# Patient Record
Sex: Female | Born: 1965 | Race: Black or African American | Hispanic: No | Marital: Single | Smoking: Never smoker
Health system: Southern US, Community
[De-identification: ages and names within clinical notes are randomized; demographics above are authoritative.]

## PROBLEM LIST (undated history)

## (undated) DIAGNOSIS — R51 Headache: Secondary | ICD-10-CM

## (undated) DIAGNOSIS — L659 Nonscarring hair loss, unspecified: Secondary | ICD-10-CM

## (undated) DIAGNOSIS — E785 Hyperlipidemia, unspecified: Secondary | ICD-10-CM

## (undated) HISTORY — PX: ANKLE FRACTURE SURGERY: SHX122

## (undated) HISTORY — DX: Hyperlipidemia, unspecified: E78.5

## (undated) HISTORY — DX: Headache: R51

## (undated) HISTORY — PX: OTHER SURGICAL HISTORY: SHX169

## (undated) HISTORY — DX: Nonscarring hair loss, unspecified: L65.9

---

## 2005-04-24 ENCOUNTER — Ambulatory Visit (HOSPITAL_COMMUNITY): Admission: RE | Admit: 2005-04-24 | Discharge: 2005-04-24 | Payer: Self-pay | Admitting: Unknown Physician Specialty

## 2005-05-13 ENCOUNTER — Encounter: Admission: RE | Admit: 2005-05-13 | Discharge: 2005-05-13 | Payer: Self-pay | Admitting: Unknown Physician Specialty

## 2007-02-17 ENCOUNTER — Ambulatory Visit (HOSPITAL_COMMUNITY): Admission: RE | Admit: 2007-02-17 | Discharge: 2007-02-17 | Payer: Self-pay | Admitting: Obstetrics and Gynecology

## 2008-02-18 ENCOUNTER — Ambulatory Visit (HOSPITAL_COMMUNITY): Admission: RE | Admit: 2008-02-18 | Discharge: 2008-02-18 | Payer: Self-pay | Admitting: Obstetrics and Gynecology

## 2009-02-26 ENCOUNTER — Ambulatory Visit (HOSPITAL_COMMUNITY): Admission: RE | Admit: 2009-02-26 | Discharge: 2009-02-26 | Payer: Self-pay | Admitting: Obstetrics and Gynecology

## 2010-02-25 ENCOUNTER — Ambulatory Visit (HOSPITAL_COMMUNITY)
Admission: RE | Admit: 2010-02-25 | Discharge: 2010-02-25 | Payer: Self-pay | Source: Home / Self Care | Attending: Obstetrics and Gynecology | Admitting: Obstetrics and Gynecology

## 2010-03-03 ENCOUNTER — Encounter: Payer: Self-pay | Admitting: Unknown Physician Specialty

## 2010-05-08 ENCOUNTER — Ambulatory Visit: Payer: Self-pay | Admitting: *Deleted

## 2010-06-04 ENCOUNTER — Encounter: Payer: BC Managed Care – PPO | Attending: Obstetrics and Gynecology | Admitting: *Deleted

## 2010-06-04 DIAGNOSIS — E119 Type 2 diabetes mellitus without complications: Secondary | ICD-10-CM | POA: Insufficient documentation

## 2010-06-04 DIAGNOSIS — Z713 Dietary counseling and surveillance: Secondary | ICD-10-CM | POA: Insufficient documentation

## 2011-02-24 ENCOUNTER — Other Ambulatory Visit (HOSPITAL_COMMUNITY): Payer: Self-pay | Admitting: Obstetrics and Gynecology

## 2011-02-24 DIAGNOSIS — Z1231 Encounter for screening mammogram for malignant neoplasm of breast: Secondary | ICD-10-CM

## 2011-03-28 ENCOUNTER — Ambulatory Visit (HOSPITAL_COMMUNITY)
Admission: RE | Admit: 2011-03-28 | Discharge: 2011-03-28 | Disposition: A | Discharge status: Home / Self Care | Payer: BC Managed Care – PPO | Source: Ambulatory Visit | Attending: Obstetrics and Gynecology | Admitting: Obstetrics and Gynecology

## 2011-03-28 DIAGNOSIS — Z1231 Encounter for screening mammogram for malignant neoplasm of breast: Secondary | ICD-10-CM

## 2011-04-15 ENCOUNTER — Ambulatory Visit (INDEPENDENT_AMBULATORY_CARE_PROVIDER_SITE_OTHER): Payer: BC Managed Care – PPO | Admitting: Obstetrics and Gynecology

## 2011-04-15 DIAGNOSIS — Z0000 Encounter for general adult medical examination without abnormal findings: Secondary | ICD-10-CM

## 2011-04-15 DIAGNOSIS — Z124 Encounter for screening for malignant neoplasm of cervix: Secondary | ICD-10-CM

## 2011-04-15 DIAGNOSIS — Z01419 Encounter for gynecological examination (general) (routine) without abnormal findings: Secondary | ICD-10-CM

## 2011-05-14 ENCOUNTER — Encounter (INDEPENDENT_AMBULATORY_CARE_PROVIDER_SITE_OTHER): Payer: BC Managed Care – PPO | Admitting: Obstetrics and Gynecology

## 2011-05-14 DIAGNOSIS — E785 Hyperlipidemia, unspecified: Secondary | ICD-10-CM

## 2011-06-24 ENCOUNTER — Other Ambulatory Visit: Payer: BC Managed Care – PPO

## 2011-06-24 DIAGNOSIS — Z79899 Other long term (current) drug therapy: Secondary | ICD-10-CM

## 2011-06-24 DIAGNOSIS — E785 Hyperlipidemia, unspecified: Secondary | ICD-10-CM

## 2011-06-25 ENCOUNTER — Encounter: Payer: BC Managed Care – PPO | Admitting: Obstetrics and Gynecology

## 2011-06-25 LAB — COMPREHENSIVE METABOLIC PANEL
Albumin: 3.9 g/dL (ref 3.5–5.2)
Alkaline Phosphatase: 43 U/L (ref 39–117)
CO2: 25 mEq/L (ref 19–32)
Total Bilirubin: 0.2 mg/dL — ABNORMAL LOW (ref 0.3–1.2)

## 2011-07-01 ENCOUNTER — Encounter: Payer: Self-pay | Admitting: Obstetrics and Gynecology

## 2011-07-01 ENCOUNTER — Ambulatory Visit (INDEPENDENT_AMBULATORY_CARE_PROVIDER_SITE_OTHER): Payer: BC Managed Care – PPO | Admitting: Obstetrics and Gynecology

## 2011-07-01 VITALS — BP 122/80 | Resp 16 | Ht 63.0 in | Wt 168.0 lb

## 2011-07-01 DIAGNOSIS — R51 Headache: Secondary | ICD-10-CM

## 2011-07-01 DIAGNOSIS — G43829 Menstrual migraine, not intractable, without status migrainosus: Secondary | ICD-10-CM

## 2011-07-01 DIAGNOSIS — E669 Obesity, unspecified: Secondary | ICD-10-CM

## 2011-07-01 DIAGNOSIS — E7800 Pure hypercholesterolemia, unspecified: Secondary | ICD-10-CM

## 2011-07-01 NOTE — Progress Notes (Signed)
Ms. Claire Lee is a 46 y.o. year old female,G1P1, who presents for a problem visit. She has a history of hypercholesterolemia.  She was started on pravastatin 40 mg each day.  Her comprehensive metabolic panel was normal.  Subjective:  No side effects associated with the medication  Objective:  BP 122/80  Resp 16  Ht 5\' 3"  (1.6 m)  Wt 168 lb (76.204 kg)  BMI 29.76 kg/m2  LMP 06/09/2011   General: alert and cooperative  Exam deferred.  Assessment:  Hypercholesterolemia  Obesity  Tolerating pravastatin well  Plan:  Returned in October 2013. Repeat lipids Diet, exercise, medications, and side effects were reviewed.  Return to office in 5 month(s).   Leonard Schwartz M.D.  07/01/2011 5:16 PM

## 2011-07-02 DIAGNOSIS — E7800 Pure hypercholesterolemia, unspecified: Secondary | ICD-10-CM | POA: Insufficient documentation

## 2011-07-02 DIAGNOSIS — R51 Headache: Secondary | ICD-10-CM | POA: Insufficient documentation

## 2011-07-02 DIAGNOSIS — R519 Headache, unspecified: Secondary | ICD-10-CM | POA: Insufficient documentation

## 2011-07-02 DIAGNOSIS — E669 Obesity, unspecified: Secondary | ICD-10-CM | POA: Insufficient documentation

## 2011-07-10 ENCOUNTER — Telehealth: Payer: Self-pay | Admitting: Obstetrics and Gynecology

## 2011-07-10 NOTE — Telephone Encounter (Signed)
Latoya/AVS pt. °

## 2011-07-11 ENCOUNTER — Telehealth: Payer: Self-pay

## 2011-07-11 NOTE — Telephone Encounter (Signed)
Pt call was returned on 07-11-11.  Pt states she has not had her cycle in two months.  She stated that her pharm [facility] in [facility] seems to always change her birth control.  pt is currently on Junel. She states she is not pregnant b/c she is not sexually active.  And she would appreciate if her birth control was changed to something stronger to get her cycle regulated.  Pt does not want to continue Junel since her cycle has not started.   She would like just a "month supply" of a new birth control to see if it will help start her cycle.  Pt made aware that Dr.Stringer is out of the office today and it will be next week that Rx may be called in. [facility] CMA

## 2011-07-12 MED ORDER — NORETHINDRONE-ETH ESTRADIOL 0.5-35 MG-MCG PO TABS: 1.0000 | ORAL_TABLET | Freq: Every day | ORAL | Status: DC

## 2011-07-16 ENCOUNTER — Telehealth: Payer: Self-pay

## 2011-07-16 NOTE — Telephone Encounter (Signed)
Pt confirmed that she received Rx and she will give [place] a call back to f/u. [facility] CMA

## 2011-07-16 NOTE — Telephone Encounter (Signed)
I called [facility] in [place] to confirm that Rx for Necon was received per Dr.Stringer.  per pharmacist  Selena Batten it has been received. Pt was called to be made aware but was unava. LVMOM for pt to call back.

## 2011-07-16 NOTE — Telephone Encounter (Signed)
Per Dr.Stringer 07-15-11 he prescribed Necon 1/35 mcg for pt and it was sent to her pharm.

## 2011-11-27 ENCOUNTER — Other Ambulatory Visit: Payer: Self-pay | Admitting: Obstetrics and Gynecology

## 2011-12-01 ENCOUNTER — Other Ambulatory Visit: Payer: Self-pay | Admitting: Obstetrics and Gynecology

## 2011-12-01 ENCOUNTER — Telehealth: Payer: Self-pay | Admitting: Obstetrics and Gynecology
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
## 2011-12-02 ENCOUNTER — Other Ambulatory Visit: Payer: Self-pay | Admitting: Obstetrics and Gynecology

## 2011-12-02 ENCOUNTER — Telehealth: Payer: Self-pay | Admitting: Obstetrics and Gynecology

## 2011-12-02 MED ORDER — NORETHINDRONE-ETH ESTRADIOL 0.5-35 MG-MCG PO TABS: 1.0000 | ORAL_TABLET | Freq: Every day | ORAL | Status: AC

## 2011-12-02 NOTE — Telephone Encounter (Signed)
Tc to pt regarding msg.  Pt states that pharmacist was notified that pt needed an appt before having rx refilled.  Pt had her AEX 3/13, was started on new rx for BCPs in 6/13.  Per AVS ok to refill Necon until next AEX.  Necon e-prescribed to pt's pharmacy, pt made aware.

## 2012-03-02 ENCOUNTER — Other Ambulatory Visit: Payer: Self-pay | Admitting: Obstetrics and Gynecology

## 2012-03-02 DIAGNOSIS — Z1231 Encounter for screening mammogram for malignant neoplasm of breast: Secondary | ICD-10-CM

## 2012-04-02 ENCOUNTER — Ambulatory Visit (HOSPITAL_COMMUNITY)
Admission: RE | Admit: 2012-04-02 | Discharge: 2012-04-02 | Disposition: A | Discharge status: Home / Self Care | Payer: BC Managed Care – PPO | Source: Ambulatory Visit | Attending: Obstetrics and Gynecology | Admitting: Obstetrics and Gynecology

## 2012-04-02 DIAGNOSIS — Z1231 Encounter for screening mammogram for malignant neoplasm of breast: Secondary | ICD-10-CM | POA: Insufficient documentation

## 2012-04-05 ENCOUNTER — Other Ambulatory Visit: Payer: Self-pay | Admitting: Obstetrics and Gynecology

## 2012-04-05 DIAGNOSIS — R928 Other abnormal and inconclusive findings on diagnostic imaging of breast: Secondary | ICD-10-CM

## 2012-04-19 ENCOUNTER — Other Ambulatory Visit: Payer: BC Managed Care – PPO

## 2012-04-28 ENCOUNTER — Ambulatory Visit
Admission: RE | Admit: 2012-04-28 | Discharge: 2012-04-28 | Disposition: A | Discharge status: Home / Self Care | Payer: BC Managed Care – PPO | Source: Ambulatory Visit | Attending: Obstetrics and Gynecology | Admitting: Obstetrics and Gynecology

## 2012-04-28 DIAGNOSIS — R928 Other abnormal and inconclusive findings on diagnostic imaging of breast: Secondary | ICD-10-CM

## 2013-03-08 ENCOUNTER — Other Ambulatory Visit: Payer: Self-pay | Admitting: Obstetrics and Gynecology

## 2013-03-08 DIAGNOSIS — Z1231 Encounter for screening mammogram for malignant neoplasm of breast: Secondary | ICD-10-CM

## 2013-04-07 ENCOUNTER — Ambulatory Visit (HOSPITAL_COMMUNITY): Payer: BC Managed Care – PPO

## 2013-04-14 ENCOUNTER — Ambulatory Visit (HOSPITAL_COMMUNITY)
Admission: RE | Admit: 2013-04-14 | Discharge: 2013-04-14 | Disposition: A | Discharge status: Home / Self Care | Payer: BC Managed Care – PPO | Source: Ambulatory Visit | Attending: Obstetrics and Gynecology | Admitting: Obstetrics and Gynecology

## 2013-04-14 DIAGNOSIS — Z1231 Encounter for screening mammogram for malignant neoplasm of breast: Secondary | ICD-10-CM | POA: Insufficient documentation

## 2013-04-15 ENCOUNTER — Emergency Department (HOSPITAL_BASED_OUTPATIENT_CLINIC_OR_DEPARTMENT_OTHER)
Admission: EM | Admit: 2013-04-15 | Discharge: 2013-04-15 | Disposition: A | Discharge status: Home / Self Care | Payer: BC Managed Care – PPO | Attending: Emergency Medicine | Admitting: Emergency Medicine

## 2013-04-15 ENCOUNTER — Encounter (HOSPITAL_BASED_OUTPATIENT_CLINIC_OR_DEPARTMENT_OTHER): Payer: Self-pay | Admitting: Emergency Medicine

## 2013-04-15 ENCOUNTER — Emergency Department (HOSPITAL_BASED_OUTPATIENT_CLINIC_OR_DEPARTMENT_OTHER): Payer: BC Managed Care – PPO

## 2013-04-15 DIAGNOSIS — M549 Dorsalgia, unspecified: Secondary | ICD-10-CM

## 2013-04-15 DIAGNOSIS — Y92410 Unspecified street and highway as the place of occurrence of the external cause: Secondary | ICD-10-CM | POA: Insufficient documentation

## 2013-04-15 DIAGNOSIS — Y9389 Activity, other specified: Secondary | ICD-10-CM | POA: Insufficient documentation

## 2013-04-15 DIAGNOSIS — Z862 Personal history of diseases of the blood and blood-forming organs and certain disorders involving the immune mechanism: Secondary | ICD-10-CM | POA: Insufficient documentation

## 2013-04-15 DIAGNOSIS — S79919A Unspecified injury of unspecified hip, initial encounter: Secondary | ICD-10-CM | POA: Insufficient documentation

## 2013-04-15 DIAGNOSIS — S8990XA Unspecified injury of unspecified lower leg, initial encounter: Secondary | ICD-10-CM | POA: Insufficient documentation

## 2013-04-15 DIAGNOSIS — IMO0002 Reserved for concepts with insufficient information to code with codable children: Secondary | ICD-10-CM | POA: Insufficient documentation

## 2013-04-15 DIAGNOSIS — S99929A Unspecified injury of unspecified foot, initial encounter: Secondary | ICD-10-CM

## 2013-04-15 DIAGNOSIS — S79929A Unspecified injury of unspecified thigh, initial encounter: Secondary | ICD-10-CM

## 2013-04-15 DIAGNOSIS — Z79899 Other long term (current) drug therapy: Secondary | ICD-10-CM | POA: Insufficient documentation

## 2013-04-15 DIAGNOSIS — L659 Nonscarring hair loss, unspecified: Secondary | ICD-10-CM | POA: Insufficient documentation

## 2013-04-15 DIAGNOSIS — S99919A Unspecified injury of unspecified ankle, initial encounter: Secondary | ICD-10-CM

## 2013-04-15 DIAGNOSIS — Z8639 Personal history of other endocrine, nutritional and metabolic disease: Secondary | ICD-10-CM | POA: Insufficient documentation

## 2013-04-15 MED ORDER — IBUPROFEN 800 MG PO TABS
800.0000 mg | ORAL_TABLET | Freq: Once | ORAL | Status: AC
  Administered 2013-04-15: 800 mg via ORAL
  Filled 2013-04-15: qty 1

## 2013-04-15 NOTE — Discharge Instructions (Signed)
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
## 2013-12-12 ENCOUNTER — Encounter (HOSPITAL_BASED_OUTPATIENT_CLINIC_OR_DEPARTMENT_OTHER): Payer: Self-pay | Admitting: Emergency Medicine

## 2014-03-22 ENCOUNTER — Other Ambulatory Visit (HOSPITAL_COMMUNITY): Payer: Self-pay | Admitting: Obstetrics and Gynecology

## 2014-03-22 DIAGNOSIS — Z1231 Encounter for screening mammogram for malignant neoplasm of breast: Secondary | ICD-10-CM

## 2014-04-26 ENCOUNTER — Ambulatory Visit (HOSPITAL_COMMUNITY): Payer: BLUE CROSS/BLUE SHIELD

## 2014-04-27 ENCOUNTER — Ambulatory Visit (HOSPITAL_COMMUNITY)
Admission: RE | Admit: 2014-04-27 | Discharge: 2014-04-27 | Disposition: A | Discharge status: Home / Self Care | Payer: BLUE CROSS/BLUE SHIELD | Source: Ambulatory Visit | Attending: Obstetrics and Gynecology | Admitting: Obstetrics and Gynecology

## 2014-04-27 DIAGNOSIS — Z1231 Encounter for screening mammogram for malignant neoplasm of breast: Secondary | ICD-10-CM | POA: Diagnosis not present

## 2014-04-28 ENCOUNTER — Other Ambulatory Visit: Payer: Self-pay | Admitting: Obstetrics and Gynecology

## 2014-04-28 DIAGNOSIS — R928 Other abnormal and inconclusive findings on diagnostic imaging of breast: Secondary | ICD-10-CM

## 2014-05-03 ENCOUNTER — Ambulatory Visit
Admission: RE | Admit: 2014-05-03 | Discharge: 2014-05-03 | Disposition: A | Discharge status: Home / Self Care | Payer: BLUE CROSS/BLUE SHIELD | Source: Ambulatory Visit | Attending: Obstetrics and Gynecology | Admitting: Obstetrics and Gynecology

## 2014-05-03 DIAGNOSIS — R928 Other abnormal and inconclusive findings on diagnostic imaging of breast: Secondary | ICD-10-CM

## 2014-08-03 ENCOUNTER — Other Ambulatory Visit: Payer: Self-pay | Admitting: Obstetrics and Gynecology

## 2014-08-03 DIAGNOSIS — N63 Unspecified lump in unspecified breast: Secondary | ICD-10-CM

## 2014-08-09 ENCOUNTER — Ambulatory Visit
Admission: RE | Admit: 2014-08-09 | Discharge: 2014-08-09 | Disposition: A | Discharge status: Home / Self Care | Payer: 59 | Source: Ambulatory Visit | Attending: Obstetrics and Gynecology | Admitting: Obstetrics and Gynecology

## 2014-08-09 DIAGNOSIS — N63 Unspecified lump in unspecified breast: Secondary | ICD-10-CM

## 2015-04-23 ENCOUNTER — Other Ambulatory Visit: Payer: Self-pay

## 2015-04-23 DIAGNOSIS — Z1231 Encounter for screening mammogram for malignant neoplasm of breast: Secondary | ICD-10-CM

## 2015-05-11 ENCOUNTER — Ambulatory Visit
Admission: RE | Admit: 2015-05-11 | Discharge: 2015-05-11 | Disposition: A | Discharge status: Home / Self Care | Payer: 59 | Source: Ambulatory Visit

## 2015-05-11 DIAGNOSIS — Z1231 Encounter for screening mammogram for malignant neoplasm of breast: Secondary | ICD-10-CM

## 2016-01-14 ENCOUNTER — Ambulatory Visit (HOSPITAL_COMMUNITY): Admission: RE | Admit: 2016-01-14 | Payer: 59 | Source: Ambulatory Visit | Admitting: Obstetrics and Gynecology

## 2016-01-14 ENCOUNTER — Encounter (HOSPITAL_COMMUNITY): Admission: RE | Payer: Self-pay | Source: Ambulatory Visit

## 2016-01-14 SURGERY — HYSTERECTOMY, VAGINAL
Anesthesia: General | Laterality: Bilateral

## 2016-02-26 ENCOUNTER — Ambulatory Visit (INDEPENDENT_AMBULATORY_CARE_PROVIDER_SITE_OTHER): Payer: 59 | Admitting: Endocrinology

## 2016-02-26 ENCOUNTER — Encounter: Payer: Self-pay | Admitting: Endocrinology

## 2016-02-26 VITALS — BP 148/98 | HR 81 | Ht 63.5 in | Wt 183.0 lb

## 2016-02-26 DIAGNOSIS — R030 Elevated blood-pressure reading, without diagnosis of hypertension: Secondary | ICD-10-CM

## 2016-02-26 DIAGNOSIS — L659 Nonscarring hair loss, unspecified: Secondary | ICD-10-CM

## 2016-02-26 DIAGNOSIS — N912 Amenorrhea, unspecified: Secondary | ICD-10-CM | POA: Diagnosis not present

## 2016-02-26 NOTE — Progress Notes (Signed)
Patient ID: Claire Lee, female   DOB: 07-13-1965, 51 y.o.   MRN: [id number]           Referring physician: Stefano GaulStringer  Chief complaint: ?  Hormonal problems  History of Present Illness:  About 10 years ago the patient stopped having menstrual cycles Also she was starting to lose hair She does not know what her initial evaluation shown in no records available of this She was started on birth control pills by her gynecologist and apparently with this she started hanging normal menstrual cycles Also her hair shedding improved with the birth control pills  More recently the patient had been having problems with heavy menstrual cycles with pain and has been recommended partial hysterectomy for her fibroids She was told her ovaries look normal on the ultrasound and no formal report is available She does want to have her hormones evaluated now She thinks that when she stopped her birth control pills last August her menstrual cycle did not come back and she started losing more here  She went back on the birth control pill about 2 weeks ago and is taking only norethindrone      Past Medical History:  Diagnosis Date  . Hair loss    08/2005  . Headache(784.0)    prior to cycles.  . Hyperlipidemia     Past Surgical History:  Procedure Laterality Date  . ANKLE FRACTURE SURGERY    . wisedom teeth      Family History  Problem Relation Age of Onset  . Diabetes Mother   . Arthritis Mother   . Thyroid disease Mother   . Diabetes Brother   . Cancer Neg Hx     Social History:  reports that she has never smoked. She has never used smokeless tobacco. She reports that she does not drink alcohol or use drugs.  Allergies: No Known Allergies  Allergies as of 02/26/2016   No Known Allergies     Medication List       Accurate as of 02/26/16 11:58 AM. Always use your most recent med list.          norethindrone-ethinyl estradiol 0.5-35 MG-MCG tablet Commonly known as:   NECON,BREVICON,MODICON Take 1 tablet by mouth daily.   norethindrone-ethinyl estradiol 1-20 MG-MCG tablet Commonly known as:  JUNEL FE,GILDESS FE,LOESTRIN FE Take 1 tablet by mouth daily.       LABS:  No visits with results within 1 Week(s) from this visit.  Latest known visit with results is:  Lab on 06/24/2011  Component Date Value Ref Range Status  . Sodium 06/25/2011 137  135 - 145 mEq/L Final  . Potassium 06/25/2011 4.4  3.5 - 5.3 mEq/L Final  . Chloride 06/25/2011 104  96 - 112 mEq/L Final  . CO2 06/25/2011 25  19 - 32 mEq/L Final  . Glucose, Bld 06/25/2011 79  70 - 99 mg/dL Final  . BUN 35/57/322005/15/2013 13  6 - 23 mg/dL Final  . Creat 25/42/706205/15/2013 0.87  0.50 - 1.10 mg/dL Final  . Total Bilirubin 06/25/2011 0.2* 0.3 - 1.2 mg/dL Final  . Alkaline Phosphatase 06/25/2011 43  39 - 117 U/L Final  . AST 06/25/2011 15  0 - 37 U/L Final  . ALT 06/25/2011 8  0 - 35 U/L Final  . Total Protein 06/25/2011 6.6  6.0 - 8.3 g/dL Final  . Albumin 37/62/831505/15/2013 3.9  3.5 - 5.2 g/dL Final  . Calcium 17/61/607305/15/2013 9.1  8.4 - 10.5 mg/dL Final  Review of Systems  Constitutional: Negative for malaise.       Her weight has been fairly stable over the last few years within 5-10 pounds  HENT: Negative for headaches.   Eyes: Negative for visual disturbance.  Respiratory: Negative for daytime sleepiness.   Cardiovascular: Negative for leg swelling.  Gastrointestinal: Positive for constipation.  Endocrine: Positive for menstrual changes. Negative for fatigue, cold intolerance and breast discharge.       Recently has heavier menstrual cycle with low back pain and cramping.  Genitourinary: Negative for frequency.  Musculoskeletal: Negative for joint pain.  Skin: Negative for abnormal pigmentation.       She has been told to have psoriasis followed by dermatologist  Psychiatric/Behavioral: Negative for insomnia.     PHYSICAL EXAM:  BP (!) 148/98   Pulse 81   Ht 5' 3.5" (1.613 m)   Wt 183 lb (83  kg)   SpO2 98%   BMI 31.91 kg/m   GENERAL: She has mild generalized obesity,No cushingoid features   No pallor, clubbing, lymphadenopathy or edema.   Skin: Has minimal facial hirsutism.  Has only mild signs of acanthosis on her neck, no pigmentation on her extremities no rash or pigmentation.  Also has only minimal lower abdominal midline hair Has no thinning of the skin or acne Has moderate degree of alopecia, mostly on the vertex and some anteriorly  EYES:  Externally normal.  Fundii:  normal discs and vessels.  ENT: Oral mucosa and tongue normal.  THYROID:  Not palpable.  HEART:  Normal  S1 and S2; no murmur or click.  CHEST:  Normal shape.  Lungs: Vescicular breath sounds heard equally.  No crepitations/ wheeze.  ABDOMEN:  No distention.  Liver and spleen not palpable.  No other mass or tenderness.  NEUROLOGICAL: .Reflexes are bilaterally normal at biceps  JOINTS:  Normal.   ASSESSMENT:    Probable PCOS with history of amenorrhea and hair loss.  She has not been evaluated recently and not clear if she was initially evaluated at the time of diagnosis 10 years ago. She does appear to have had good clinical response to using birth control pills over the last 10 years with improvement of hair loss and regular menstrual cycles  ?  Hypertension: Blood pressure is unusually high today  High A1c by history but has had normal blood sugars in the past  History of vitamin D deficiency: Currently not taking any specific supplement for this   PLAN:    Although she went back on her birth control pills 2 weeks ago will need to evaluate early morning free testosterone, prolactin and DHEAS level at the minimum.   Reminded her to take vitamin D3, at least 2000 units daily for supplementation  Needs regular follow-up of blood pressure   Consultation note sent to the referring physician  [facility] 02/26/2016, 11:58 AM

## 2016-02-26 NOTE — Patient Instructions (Signed)
D3, 2000 units daily

## 2016-02-29 ENCOUNTER — Other Ambulatory Visit: Payer: 59

## 2016-03-12 ENCOUNTER — Other Ambulatory Visit: Payer: 59

## 2016-03-12 DIAGNOSIS — N912 Amenorrhea, unspecified: Secondary | ICD-10-CM

## 2016-03-13 LAB — DHEA-SULFATE: DHEA-SO4: 54.5 ug/dL (ref 41.2–243.7)

## 2016-03-13 LAB — TESTOSTERONE, FREE, TOTAL, SHBG
Sex Hormone Binding: 21.5 nmol/L — ABNORMAL LOW (ref 24.6–122.0)
TESTOSTERONE: 9 ng/dL (ref 8–48)
Testosterone, Free: 2.5 pg/mL (ref 0.0–4.2)

## 2016-03-13 LAB — PROLACTIN: Prolactin: 12.9 ng/mL (ref 4.8–23.3)

## 2016-03-21 ENCOUNTER — Telehealth: Payer: Self-pay | Admitting: Endocrinology

## 2016-03-21 NOTE — Telephone Encounter (Signed)
Patient calling for results for labs

## 2016-03-21 NOTE — Telephone Encounter (Signed)
Please advise. I did not see where they are been resulted by you. When they are I will call patient and let her know. Thank you!

## 2016-03-22 NOTE — Telephone Encounter (Signed)
All tests are normal; ok to proceed as planned by gynecologist

## 2016-03-24 ENCOUNTER — Telehealth: Payer: Self-pay

## 2016-03-24 NOTE — Telephone Encounter (Signed)
Called patient and gave lab results. Patient had no questions or concerns. Patient would like lab work mailed to house, which I will send out today.

## 2016-03-24 NOTE — Telephone Encounter (Signed)
Called patient and gave lab results. Patient had no questions or concerns.  

## 2016-04-07 ENCOUNTER — Other Ambulatory Visit: Payer: Self-pay | Admitting: Obstetrics & Gynecology

## 2016-04-07 DIAGNOSIS — Z1231 Encounter for screening mammogram for malignant neoplasm of breast: Secondary | ICD-10-CM

## 2016-05-06 ENCOUNTER — Telehealth: Payer: Self-pay | Admitting: Endocrinology

## 2016-05-06 NOTE — Telephone Encounter (Signed)
Would prefer to see the patient first to decide what labs to do

## 2016-05-06 NOTE — Telephone Encounter (Signed)
Pt called in and requested if she could get some lab work done, she feels that she is having some thyroid issues.

## 2016-05-06 NOTE — Telephone Encounter (Signed)
Caitlin please assist me with getting her scheduled thank you!

## 2016-05-08 ENCOUNTER — Ambulatory Visit (INDEPENDENT_AMBULATORY_CARE_PROVIDER_SITE_OTHER): Payer: 59 | Admitting: Endocrinology

## 2016-05-08 ENCOUNTER — Encounter: Payer: Self-pay | Admitting: Endocrinology

## 2016-05-08 VITALS — BP 148/98 | HR 72 | Wt 184.0 lb

## 2016-05-08 DIAGNOSIS — R1312 Dysphagia, oropharyngeal phase: Secondary | ICD-10-CM | POA: Diagnosis not present

## 2016-05-08 NOTE — Patient Instructions (Signed)
Place establish with a primary care physician for general evaluation and treatment of hypertension
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
## 2016-05-19 ENCOUNTER — Ambulatory Visit: Payer: 59

## 2016-05-20 ENCOUNTER — Ambulatory Visit
Admission: RE | Admit: 2016-05-20 | Discharge: 2016-05-20 | Disposition: A | Discharge status: Home / Self Care | Payer: 59 | Source: Ambulatory Visit | Attending: Obstetrics & Gynecology | Admitting: Obstetrics & Gynecology

## 2016-05-20 ENCOUNTER — Encounter: Payer: Self-pay | Admitting: Radiology

## 2016-05-20 DIAGNOSIS — Z1231 Encounter for screening mammogram for malignant neoplasm of breast: Secondary | ICD-10-CM

## 2016-07-13 IMAGING — MG MM DIAG BREAST TOMO UNI LEFT
5 series · 6 of 13 positions shown · non-contrast
Comparison: Priors

CLINICAL DATA: Questioned palpable finding per referring physician.
The patient states the area corresponds to the 1 o'clock location
today and can indicate at the time of physical exam.

EXAM:
DIGITAL DIAGNOSTIC left MAMMOGRAM WITH 3D TOMOSYNTHESIS WITH CAD
ULTRASOUND left BREAST

[L TAN]
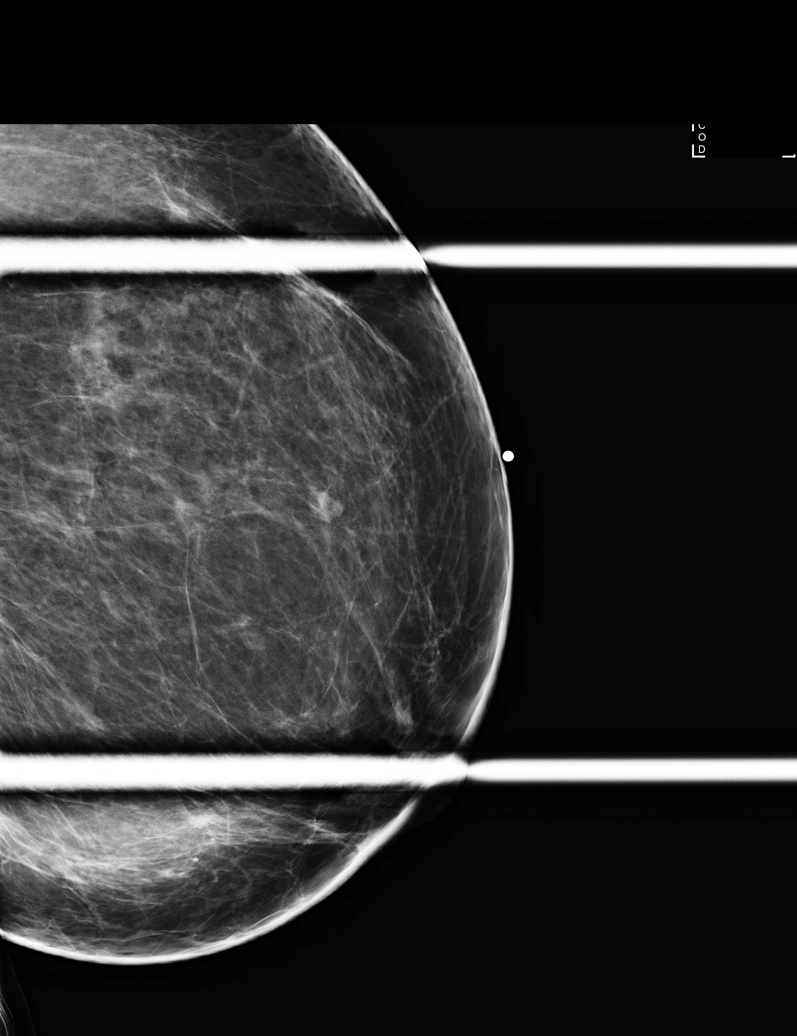

[L MLO]
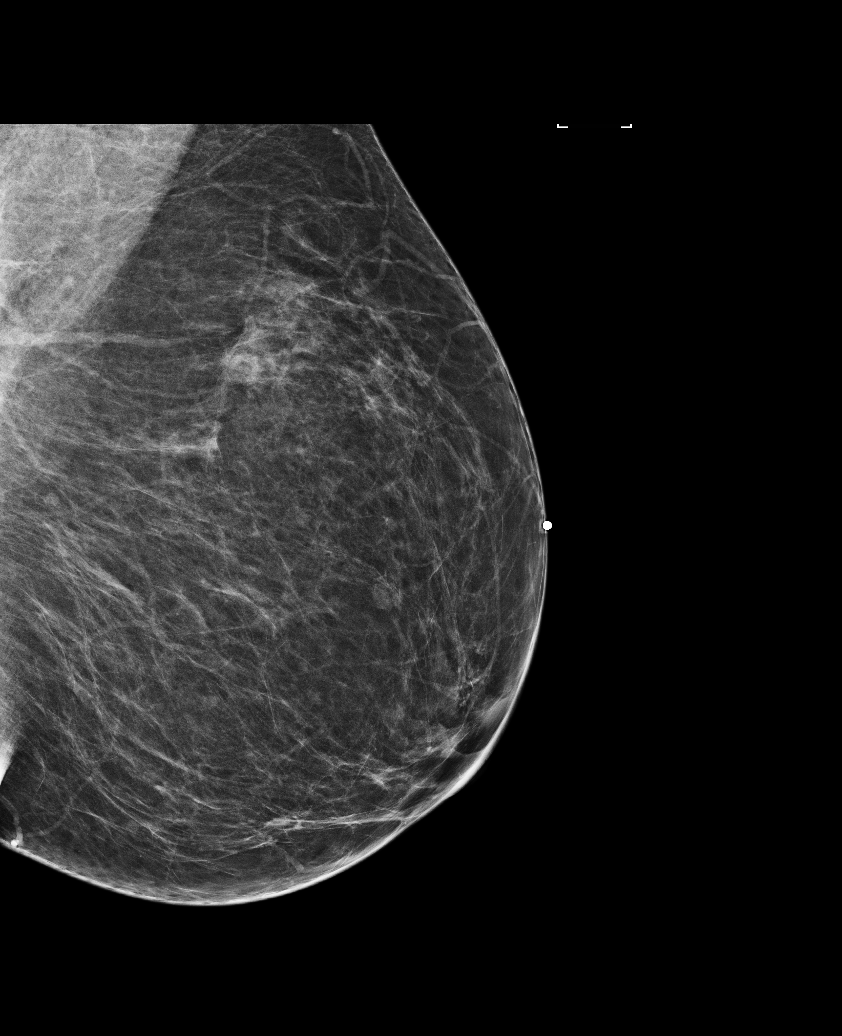

[L CC]
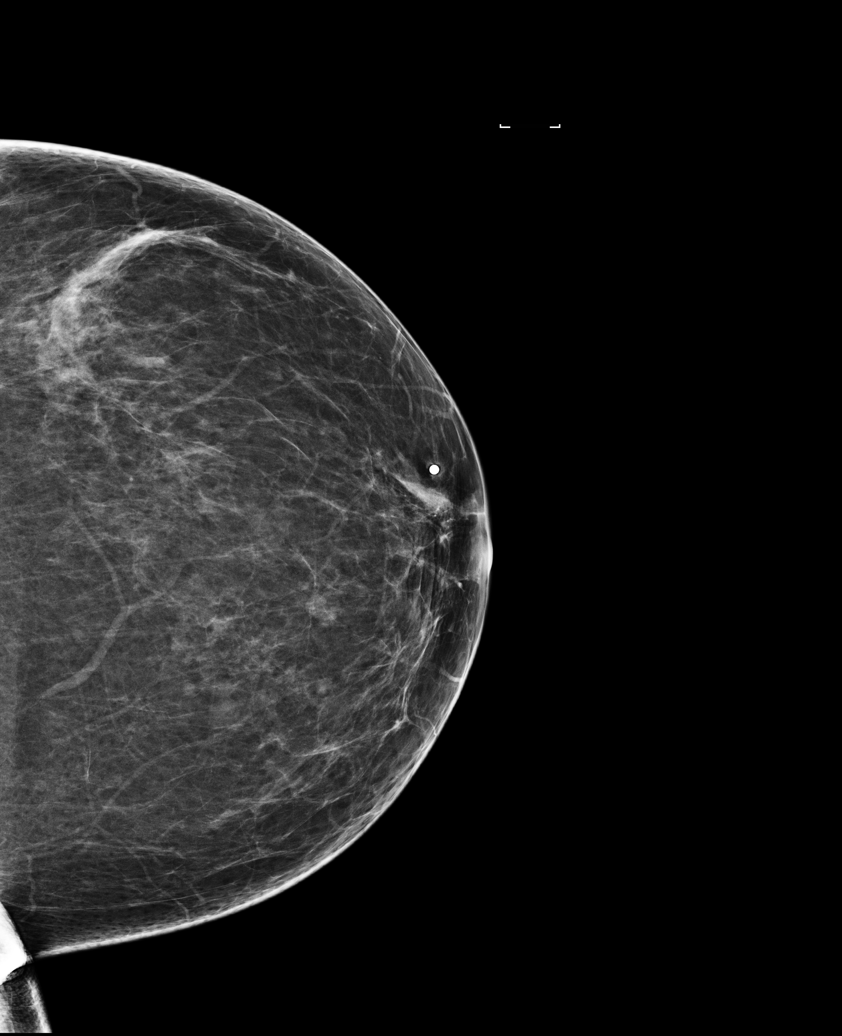

[L MLO tomo · 2 of 81 frames shown]
[frame 27/81]
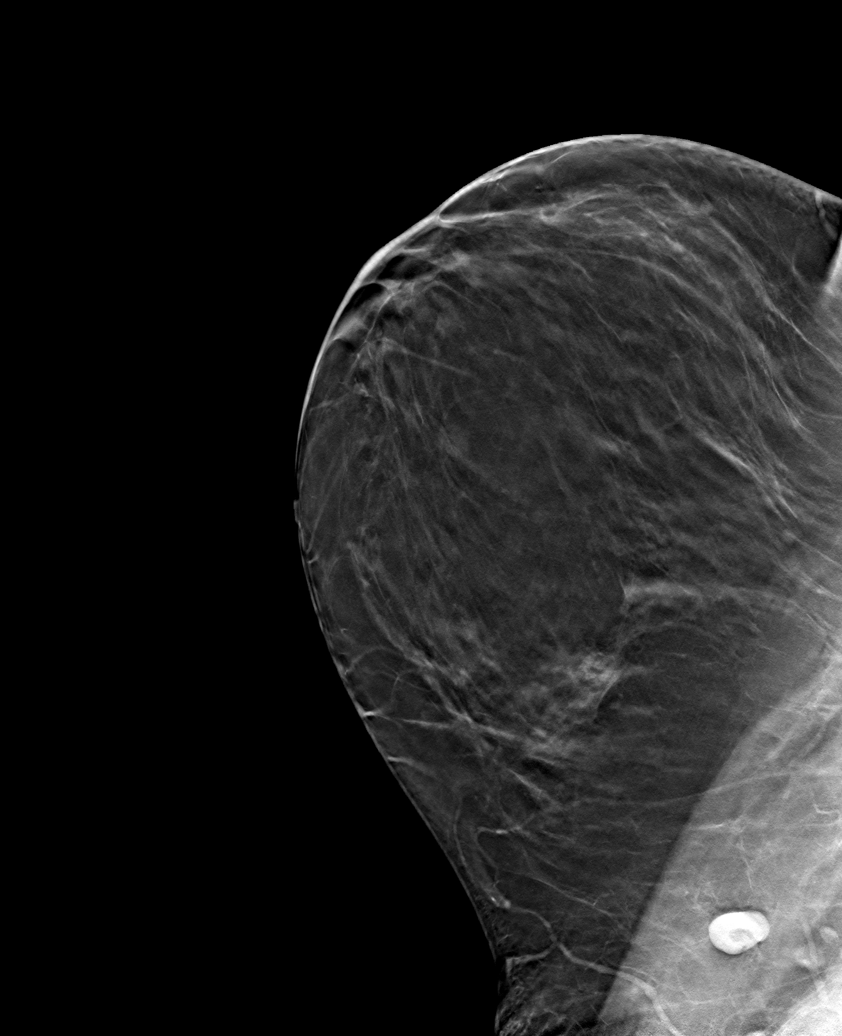
[frame 41/81]
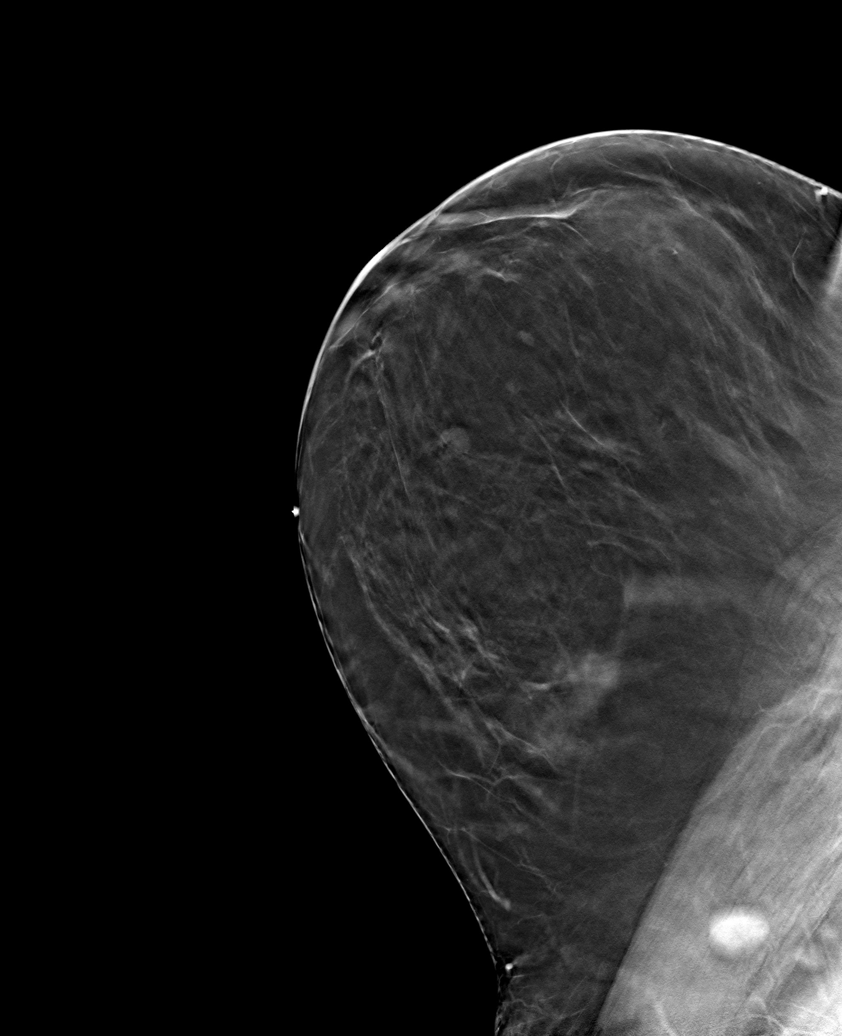

[L CC tomo · tomo slice 35/70.0]
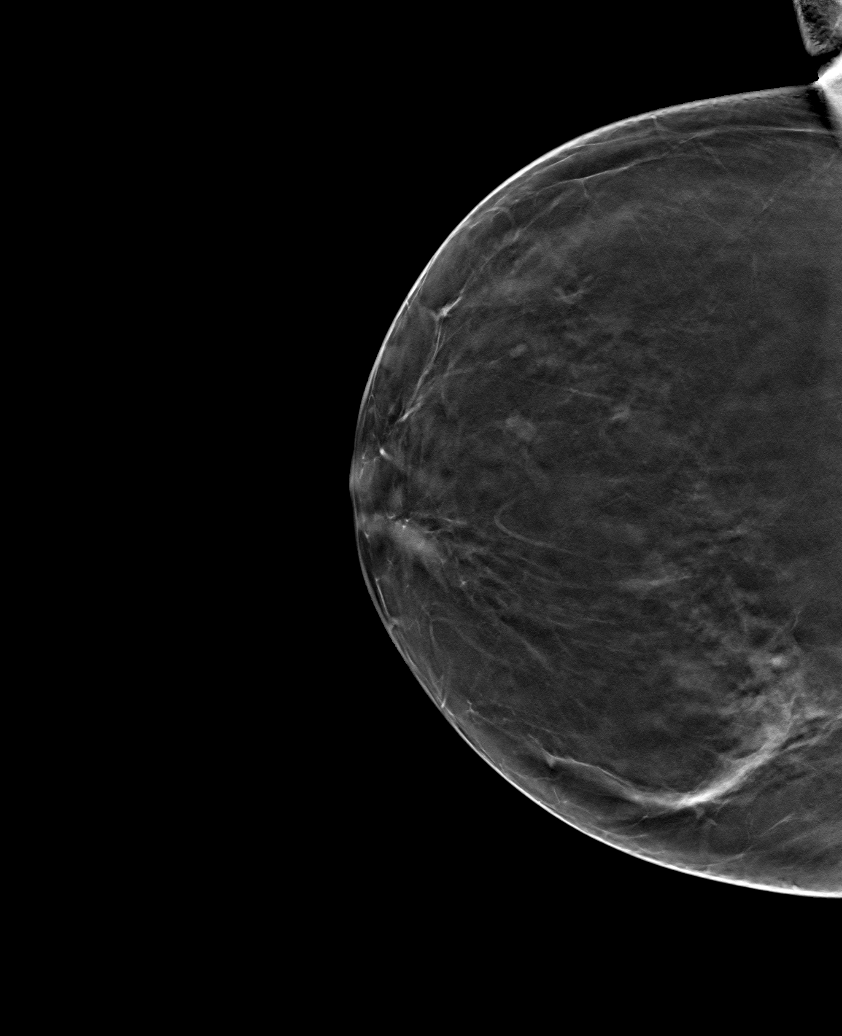

[6 of 13 positions shown; findings below may reference images not displayed]

ACR Breast Density Category b: There are scattered areas of
fibroglandular density.
FINDINGS: No mammographic abnormality is identified in the left breast. The
area of the questioned palpable finding in the upper outer quadrant
is entirely fatty and no subjacent abnormality is identified.

Mammographic images were processed with CAD.

Targeted ultrasound is performed, showing no sonographic abnormality
in the left upper outer quadrant 1 o'clock or 3 o'clock area.
IMPRESSION: No evidence for malignancy. Any further management of the questioned
palpable finding should be dictated by clinical assessment.

RECOMMENDATION:
1: Negative.

I have discussed the findings and recommendations with the patient.
Results were also provided in writing at the conclusion of the
visit. If applicable, a reminder letter will be sent to the patient
regarding the next appointment.

BI-RADS CATEGORY  Bilateral screening mammography April 2015

## 2017-04-10 ENCOUNTER — Other Ambulatory Visit: Payer: Self-pay | Admitting: Family Medicine

## 2017-04-10 DIAGNOSIS — Z1231 Encounter for screening mammogram for malignant neoplasm of breast: Secondary | ICD-10-CM

## 2017-05-21 ENCOUNTER — Ambulatory Visit
Admission: RE | Admit: 2017-05-21 | Discharge: 2017-05-21 | Disposition: A | Discharge status: Home / Self Care | Payer: 59 | Source: Ambulatory Visit | Attending: Family Medicine | Admitting: Family Medicine

## 2017-05-21 DIAGNOSIS — Z1231 Encounter for screening mammogram for malignant neoplasm of breast: Secondary | ICD-10-CM

## 2018-04-21 ENCOUNTER — Other Ambulatory Visit: Payer: Self-pay | Admitting: Family Medicine

## 2018-04-21 DIAGNOSIS — Z1231 Encounter for screening mammogram for malignant neoplasm of breast: Secondary | ICD-10-CM

## 2018-05-24 ENCOUNTER — Ambulatory Visit: Payer: 59

## 2018-07-06 ENCOUNTER — Ambulatory Visit
Admission: RE | Admit: 2018-07-06 | Discharge: 2018-07-06 | Disposition: A | Discharge status: Home / Self Care | Payer: 59 | Source: Ambulatory Visit | Attending: Family Medicine | Admitting: Family Medicine

## 2018-07-06 ENCOUNTER — Other Ambulatory Visit: Payer: Self-pay

## 2018-07-06 DIAGNOSIS — Z1231 Encounter for screening mammogram for malignant neoplasm of breast: Secondary | ICD-10-CM

## 2018-08-04 DIAGNOSIS — H9012 Conductive hearing loss, unilateral, left ear, with unrestricted hearing on the contralateral side: Secondary | ICD-10-CM | POA: Diagnosis not present

## 2018-08-04 DIAGNOSIS — H65492 Other chronic nonsuppurative otitis media, left ear: Secondary | ICD-10-CM | POA: Diagnosis not present

## 2018-08-04 DIAGNOSIS — H6982 Other specified disorders of Eustachian tube, left ear: Secondary | ICD-10-CM | POA: Diagnosis not present

## 2018-08-05 DIAGNOSIS — H6982 Other specified disorders of Eustachian tube, left ear: Secondary | ICD-10-CM | POA: Diagnosis not present

## 2018-09-06 DIAGNOSIS — H9212 Otorrhea, left ear: Secondary | ICD-10-CM | POA: Diagnosis not present

## 2018-11-30 DIAGNOSIS — I10 Essential (primary) hypertension: Secondary | ICD-10-CM | POA: Diagnosis not present

## 2018-11-30 DIAGNOSIS — R739 Hyperglycemia, unspecified: Secondary | ICD-10-CM | POA: Diagnosis not present

## 2018-11-30 DIAGNOSIS — E663 Overweight: Secondary | ICD-10-CM | POA: Diagnosis not present

## 2018-11-30 DIAGNOSIS — Z6831 Body mass index (BMI) 31.0-31.9, adult: Secondary | ICD-10-CM | POA: Diagnosis not present

## 2018-12-24 DIAGNOSIS — Z20828 Contact with and (suspected) exposure to other viral communicable diseases: Secondary | ICD-10-CM | POA: Diagnosis not present

## 2019-01-11 DIAGNOSIS — Z20828 Contact with and (suspected) exposure to other viral communicable diseases: Secondary | ICD-10-CM | POA: Diagnosis not present

## 2019-01-15 DIAGNOSIS — Z20828 Contact with and (suspected) exposure to other viral communicable diseases: Secondary | ICD-10-CM | POA: Diagnosis not present

## 2019-01-18 DIAGNOSIS — R69 Illness, unspecified: Secondary | ICD-10-CM | POA: Diagnosis not present

## 2019-01-18 DIAGNOSIS — I10 Essential (primary) hypertension: Secondary | ICD-10-CM | POA: Diagnosis not present

## 2019-02-01 DIAGNOSIS — R079 Chest pain, unspecified: Secondary | ICD-10-CM | POA: Diagnosis not present

## 2019-02-01 DIAGNOSIS — R69 Illness, unspecified: Secondary | ICD-10-CM | POA: Diagnosis not present

## 2019-02-02 DIAGNOSIS — R69 Illness, unspecified: Secondary | ICD-10-CM | POA: Diagnosis not present

## 2019-02-02 DIAGNOSIS — R079 Chest pain, unspecified: Secondary | ICD-10-CM | POA: Diagnosis not present

## 2019-02-02 DIAGNOSIS — I10 Essential (primary) hypertension: Secondary | ICD-10-CM | POA: Diagnosis not present

## 2019-02-11 DIAGNOSIS — R6883 Chills (without fever): Secondary | ICD-10-CM | POA: Diagnosis not present

## 2019-02-11 DIAGNOSIS — R519 Headache, unspecified: Secondary | ICD-10-CM | POA: Diagnosis not present

## 2019-02-11 DIAGNOSIS — Z20822 Contact with and (suspected) exposure to covid-19: Secondary | ICD-10-CM | POA: Diagnosis not present

## 2019-02-11 DIAGNOSIS — R42 Dizziness and giddiness: Secondary | ICD-10-CM | POA: Diagnosis not present

## 2019-02-21 DIAGNOSIS — H66002 Acute suppurative otitis media without spontaneous rupture of ear drum, left ear: Secondary | ICD-10-CM | POA: Diagnosis not present

## 2019-05-03 DIAGNOSIS — R69 Illness, unspecified: Secondary | ICD-10-CM | POA: Diagnosis not present

## 2019-05-03 DIAGNOSIS — I10 Essential (primary) hypertension: Secondary | ICD-10-CM | POA: Diagnosis not present

## 2019-06-03 ENCOUNTER — Other Ambulatory Visit: Payer: Self-pay | Admitting: Nurse Practitioner

## 2019-06-03 ENCOUNTER — Other Ambulatory Visit: Payer: Self-pay | Admitting: Family Medicine

## 2019-06-03 DIAGNOSIS — Z1231 Encounter for screening mammogram for malignant neoplasm of breast: Secondary | ICD-10-CM

## 2019-06-28 DIAGNOSIS — R7301 Impaired fasting glucose: Secondary | ICD-10-CM | POA: Diagnosis not present

## 2019-06-28 DIAGNOSIS — R69 Illness, unspecified: Secondary | ICD-10-CM | POA: Diagnosis not present

## 2019-06-28 DIAGNOSIS — E785 Hyperlipidemia, unspecified: Secondary | ICD-10-CM | POA: Diagnosis not present

## 2019-07-07 DIAGNOSIS — H6982 Other specified disorders of Eustachian tube, left ear: Secondary | ICD-10-CM | POA: Diagnosis not present

## 2019-07-07 DIAGNOSIS — H903 Sensorineural hearing loss, bilateral: Secondary | ICD-10-CM | POA: Diagnosis not present

## 2019-07-07 DIAGNOSIS — Z9622 Myringotomy tube(s) status: Secondary | ICD-10-CM | POA: Diagnosis not present

## 2019-07-07 DIAGNOSIS — H9012 Conductive hearing loss, unilateral, left ear, with unrestricted hearing on the contralateral side: Secondary | ICD-10-CM | POA: Diagnosis not present

## 2019-07-08 ENCOUNTER — Other Ambulatory Visit: Payer: Self-pay

## 2019-07-08 ENCOUNTER — Ambulatory Visit
Admission: RE | Admit: 2019-07-08 | Discharge: 2019-07-08 | Disposition: A | Discharge status: Home / Self Care | Payer: No Typology Code available for payment source | Source: Ambulatory Visit | Attending: Nurse Practitioner | Admitting: Nurse Practitioner

## 2019-07-08 DIAGNOSIS — Z1231 Encounter for screening mammogram for malignant neoplasm of breast: Secondary | ICD-10-CM

## 2019-11-04 DIAGNOSIS — E785 Hyperlipidemia, unspecified: Secondary | ICD-10-CM | POA: Diagnosis not present

## 2019-11-04 DIAGNOSIS — I10 Essential (primary) hypertension: Secondary | ICD-10-CM | POA: Diagnosis not present

## 2019-11-04 DIAGNOSIS — Z136 Encounter for screening for cardiovascular disorders: Secondary | ICD-10-CM | POA: Diagnosis not present

## 2019-11-04 DIAGNOSIS — R69 Illness, unspecified: Secondary | ICD-10-CM | POA: Diagnosis not present

## 2019-11-04 DIAGNOSIS — Z01419 Encounter for gynecological examination (general) (routine) without abnormal findings: Secondary | ICD-10-CM | POA: Diagnosis not present

## 2019-11-04 DIAGNOSIS — Z1211 Encounter for screening for malignant neoplasm of colon: Secondary | ICD-10-CM | POA: Diagnosis not present

## 2019-11-04 DIAGNOSIS — Z79899 Other long term (current) drug therapy: Secondary | ICD-10-CM | POA: Diagnosis not present

## 2020-03-19 DIAGNOSIS — D259 Leiomyoma of uterus, unspecified: Secondary | ICD-10-CM | POA: Diagnosis not present

## 2020-03-19 DIAGNOSIS — Z01419 Encounter for gynecological examination (general) (routine) without abnormal findings: Secondary | ICD-10-CM | POA: Diagnosis not present

## 2020-03-19 DIAGNOSIS — R69 Illness, unspecified: Secondary | ICD-10-CM | POA: Diagnosis not present

## 2020-03-19 DIAGNOSIS — Z9889 Other specified postprocedural states: Secondary | ICD-10-CM | POA: Diagnosis not present

## 2020-03-19 DIAGNOSIS — Z01411 Encounter for gynecological examination (general) (routine) with abnormal findings: Secondary | ICD-10-CM | POA: Diagnosis not present

## 2020-03-19 DIAGNOSIS — D250 Submucous leiomyoma of uterus: Secondary | ICD-10-CM | POA: Diagnosis not present

## 2020-03-19 DIAGNOSIS — N950 Postmenopausal bleeding: Secondary | ICD-10-CM | POA: Diagnosis not present

## 2020-03-23 DIAGNOSIS — D259 Leiomyoma of uterus, unspecified: Secondary | ICD-10-CM | POA: Diagnosis not present

## 2020-03-23 DIAGNOSIS — N950 Postmenopausal bleeding: Secondary | ICD-10-CM | POA: Diagnosis not present

## 2020-03-23 DIAGNOSIS — D250 Submucous leiomyoma of uterus: Secondary | ICD-10-CM | POA: Diagnosis not present

## 2020-03-30 ENCOUNTER — Other Ambulatory Visit: Payer: Self-pay | Admitting: Nurse Practitioner

## 2020-03-30 DIAGNOSIS — Z1231 Encounter for screening mammogram for malignant neoplasm of breast: Secondary | ICD-10-CM

## 2020-04-02 DIAGNOSIS — Z87898 Personal history of other specified conditions: Secondary | ICD-10-CM | POA: Diagnosis not present

## 2020-04-02 DIAGNOSIS — N950 Postmenopausal bleeding: Secondary | ICD-10-CM | POA: Diagnosis not present

## 2020-04-02 DIAGNOSIS — L659 Nonscarring hair loss, unspecified: Secondary | ICD-10-CM | POA: Diagnosis not present

## 2020-05-01 DIAGNOSIS — N950 Postmenopausal bleeding: Secondary | ICD-10-CM | POA: Diagnosis not present

## 2020-05-18 DIAGNOSIS — J392 Other diseases of pharynx: Secondary | ICD-10-CM | POA: Diagnosis not present

## 2020-05-18 DIAGNOSIS — R69 Illness, unspecified: Secondary | ICD-10-CM | POA: Diagnosis not present

## 2020-05-18 DIAGNOSIS — H6123 Impacted cerumen, bilateral: Secondary | ICD-10-CM | POA: Diagnosis not present

## 2020-05-18 DIAGNOSIS — H60543 Acute eczematoid otitis externa, bilateral: Secondary | ICD-10-CM | POA: Diagnosis not present

## 2020-05-18 DIAGNOSIS — J343 Hypertrophy of nasal turbinates: Secondary | ICD-10-CM | POA: Diagnosis not present

## 2020-07-10 ENCOUNTER — Other Ambulatory Visit: Payer: Self-pay

## 2020-07-10 ENCOUNTER — Ambulatory Visit
Admission: RE | Admit: 2020-07-10 | Discharge: 2020-07-10 | Disposition: A | Discharge status: Home / Self Care | Payer: No Typology Code available for payment source | Source: Ambulatory Visit | Attending: Nurse Practitioner | Admitting: Nurse Practitioner

## 2020-07-10 DIAGNOSIS — Z1231 Encounter for screening mammogram for malignant neoplasm of breast: Secondary | ICD-10-CM

## 2020-07-10 DIAGNOSIS — H903 Sensorineural hearing loss, bilateral: Secondary | ICD-10-CM | POA: Diagnosis not present

## 2020-10-01 DIAGNOSIS — R69 Illness, unspecified: Secondary | ICD-10-CM | POA: Diagnosis not present

## 2020-10-01 DIAGNOSIS — Z23 Encounter for immunization: Secondary | ICD-10-CM | POA: Diagnosis not present

## 2020-10-01 DIAGNOSIS — Z1159 Encounter for screening for other viral diseases: Secondary | ICD-10-CM | POA: Diagnosis not present

## 2020-10-01 DIAGNOSIS — I10 Essential (primary) hypertension: Secondary | ICD-10-CM | POA: Diagnosis not present

## 2020-10-01 DIAGNOSIS — T148XXA Other injury of unspecified body region, initial encounter: Secondary | ICD-10-CM | POA: Diagnosis not present

## 2020-10-01 DIAGNOSIS — K2100 Gastro-esophageal reflux disease with esophagitis, without bleeding: Secondary | ICD-10-CM | POA: Diagnosis not present

## 2020-10-01 DIAGNOSIS — Z136 Encounter for screening for cardiovascular disorders: Secondary | ICD-10-CM | POA: Diagnosis not present

## 2020-12-05 DIAGNOSIS — J069 Acute upper respiratory infection, unspecified: Secondary | ICD-10-CM | POA: Diagnosis not present

## 2020-12-05 DIAGNOSIS — J40 Bronchitis, not specified as acute or chronic: Secondary | ICD-10-CM | POA: Diagnosis not present

## 2020-12-05 DIAGNOSIS — Z20822 Contact with and (suspected) exposure to covid-19: Secondary | ICD-10-CM | POA: Diagnosis not present

## 2020-12-24 DIAGNOSIS — L669 Cicatricial alopecia, unspecified: Secondary | ICD-10-CM | POA: Diagnosis not present

## 2020-12-24 DIAGNOSIS — L658 Other specified nonscarring hair loss: Secondary | ICD-10-CM | POA: Diagnosis not present

## 2020-12-24 DIAGNOSIS — L219 Seborrheic dermatitis, unspecified: Secondary | ICD-10-CM | POA: Diagnosis not present

## 2021-01-21 DIAGNOSIS — N943 Premenstrual tension syndrome: Secondary | ICD-10-CM | POA: Diagnosis not present

## 2021-01-21 DIAGNOSIS — R7301 Impaired fasting glucose: Secondary | ICD-10-CM | POA: Diagnosis not present

## 2021-01-21 DIAGNOSIS — K2100 Gastro-esophageal reflux disease with esophagitis, without bleeding: Secondary | ICD-10-CM | POA: Diagnosis not present

## 2021-01-21 DIAGNOSIS — Z0000 Encounter for general adult medical examination without abnormal findings: Secondary | ICD-10-CM | POA: Diagnosis not present

## 2021-01-21 DIAGNOSIS — R7989 Other specified abnormal findings of blood chemistry: Secondary | ICD-10-CM | POA: Diagnosis not present

## 2021-01-21 DIAGNOSIS — E119 Type 2 diabetes mellitus without complications: Secondary | ICD-10-CM | POA: Diagnosis not present

## 2021-01-21 DIAGNOSIS — E785 Hyperlipidemia, unspecified: Secondary | ICD-10-CM | POA: Diagnosis not present

## 2021-01-21 DIAGNOSIS — I10 Essential (primary) hypertension: Secondary | ICD-10-CM | POA: Diagnosis not present

## 2021-01-21 DIAGNOSIS — R69 Illness, unspecified: Secondary | ICD-10-CM | POA: Diagnosis not present

## 2021-01-21 DIAGNOSIS — D509 Iron deficiency anemia, unspecified: Secondary | ICD-10-CM | POA: Diagnosis not present

## 2021-02-12 ENCOUNTER — Encounter: Payer: 59 | Attending: Nurse Practitioner | Admitting: Registered"

## 2021-02-12 ENCOUNTER — Encounter: Payer: Self-pay | Admitting: Registered"

## 2021-02-12 ENCOUNTER — Other Ambulatory Visit: Payer: Self-pay

## 2021-02-12 DIAGNOSIS — R7301 Impaired fasting glucose: Secondary | ICD-10-CM | POA: Diagnosis not present

## 2021-02-12 DIAGNOSIS — Z713 Dietary counseling and surveillance: Secondary | ICD-10-CM | POA: Insufficient documentation

## 2021-02-12 NOTE — Patient Instructions (Addendum)
-   Aim to be consistent in having 3 meals a day around consistent times.   - Aim to increase physical activity with moderate-activity of walking on treadmill 30 min, 1-2 times/week.   - Aim to have 1/2 plate of non-starchy vegetables with lunch and dinner daily.   - Manage stress with previous coping skills from therapy.

## 2021-02-12 NOTE — Progress Notes (Addendum)
Medical Nutrition Therapy  Appointment Start time:  11:10  Appointment End time:  12:04  Primary concerns today: wants to know do's/don'ts  Referral diagnosis: impaired fasting glucose Preferred learning style: no preference indicated Learning readiness: ready, change in progress   NUTRITION ASSESSMENT   Pt states her last 3 visits to PCP, A1c has increased. States she is now taking metformin and cholesterol medications. States she is unsure of what she's doing wrong and states she exercises regularly.   Reports family history of diabetes. Reports strong history on maternal side of family  with Mom and brother. Brother is deceased. States mom uses continuous glucose monitoring.   States her A1c has been in prediabetes ranges for years prior to now and was not referred for nutrition counseling. States she was exercising a lot in 2021 when A1c was 5.4. States she cannot think of anything that has changed in her life to increase A1c (5.4) in 10/2019 to recent A1c of (6.3) in 01/2021. States stress is the culprit.   Reports she starts work Monday - Friday 6:30-4:30 pm and Friday 6:30-10:30 am. States she works from home, listens to calls all day, and forgets to sip on water. States she also works part-time job Monday - Wednesday 5:30-9 pm and Friday 2-5 pm. States she walks around at her part-time job and calculates steps while there.   States she does not eat pork, eats a lot of chicken and also eats beef and salmon as protein options. Reports she has not eaten anything fried in the past 2 weeks since receiving lab results. States her weight is fluctuating due to menopause. Reports she eats vegetables and french fries are a weakness for her. States she was eating 2-3x/week. Reports she only eats something sweet on Sundays later states she likes ice cream and cakes and will eat ice cream during the week. States she was drinking sweet tea 1-2x/week and will only eat bread when eating out. States she  typically eats out on Thursday, Friday, and Saturday.  States she returns to PCP in 04/2021 for follow-up.    Clinical Medical Hx: HTN, hyperlipidemia Medications: See list Labs: increased A1c 6.3, elevated Chol (215), elevated Trig (218), decreased HDL (34), elevated LDL Chol (141) Notable Signs/Symptoms: none reported  Lifestyle & Dietary Hx  Estimated daily fluid intake: 48 oz Supplements: See list Sleep: 6-7 hrs/night Stress / self-care: breathing techniques, exercising, listening to music Current average weekly physical activity: walking 30-60 min, 4 days/week at work; 5,000-10,000 steps/day  24-Hr Dietary Recall First Meal (9 am): protein shake (banana, 1/2 slice of avocado, cinnamon, protein mix, strawberry, spinach powder, honey, almond milk) or oatmeal or cereal (honey nut cheerios) with 2% milk or skips on Fridays Snack: fruit Second Meal (2 pm): leftovers  Snack:  Third Meal (7 or 9 pm): meatloaf + peas + mac and cheese or chicken and dumplings Snack:  Beverages: water (3*16 oz; 48 oz)   NUTRITION DIAGNOSIS  NB-1.1 Food and nutrition-related knowledge deficit As related to prediabetes.  As evidenced by pt verbalizes lack of previous nutrition-related education.   NUTRITION INTERVENTION  Nutrition education (E-1) on the following topics: Pt was educated and counseled on metabolism changes with menopause, effects of dieting/restriction, how carbohydrates work in the body, prediabetes/diabetes risk factors, A1c ranges for prediabetes/diabetes, role of fiber in eating regimen, and importance of physical activity. Discussed leisure activity versus moderate-intensity physical activity. Discussed meal/snack planning and how to balance meals/snacks using MyPlate for prediabetes. Discussed metabolism as it  relates to consistency with meals/snacks. Pt states she has stress-coping skills from utilizing a therapist in previously and will use them to help manage stress moving forward.  Pt agreed with goals listed.  Handouts Provided Include  My Plate for Prediabetes Vary Your Protein Routine  Learning Style & Readiness for Change Teaching method utilized: Visual & Auditory  Demonstrated degree of understanding via: Teach Back  Barriers to learning/adherence to lifestyle change: work-life balance  Goals Established by Pt Aim to be consistent in having 3 meals a day around consistent times.  Aim to increase physical activity with moderate-activity of walking on treadmill 30 min, 1-2 times/week.  Aim to have 1/2 plate of non-starchy vegetables with lunch and dinner daily.  Manage stress with previous coping skills from therapy.    MONITORING & EVALUATION Dietary intake, weekly physical activity.  Next Steps  Patient is to follow-up prn.

## 2021-05-01 DIAGNOSIS — H60543 Acute eczematoid otitis externa, bilateral: Secondary | ICD-10-CM | POA: Diagnosis not present

## 2021-05-01 DIAGNOSIS — H6501 Acute serous otitis media, right ear: Secondary | ICD-10-CM | POA: Diagnosis not present

## 2021-05-07 DIAGNOSIS — R7301 Impaired fasting glucose: Secondary | ICD-10-CM | POA: Diagnosis not present

## 2021-05-07 DIAGNOSIS — E785 Hyperlipidemia, unspecified: Secondary | ICD-10-CM | POA: Diagnosis not present

## 2021-05-10 DIAGNOSIS — E119 Type 2 diabetes mellitus without complications: Secondary | ICD-10-CM | POA: Diagnosis not present

## 2021-05-10 DIAGNOSIS — R7301 Impaired fasting glucose: Secondary | ICD-10-CM | POA: Diagnosis not present

## 2021-05-10 DIAGNOSIS — E785 Hyperlipidemia, unspecified: Secondary | ICD-10-CM | POA: Diagnosis not present

## 2021-05-20 DIAGNOSIS — L658 Other specified nonscarring hair loss: Secondary | ICD-10-CM | POA: Diagnosis not present

## 2021-05-20 DIAGNOSIS — L669 Cicatricial alopecia, unspecified: Secondary | ICD-10-CM | POA: Diagnosis not present

## 2021-05-28 ENCOUNTER — Other Ambulatory Visit: Payer: Self-pay | Admitting: Nurse Practitioner

## 2021-05-28 DIAGNOSIS — Z1231 Encounter for screening mammogram for malignant neoplasm of breast: Secondary | ICD-10-CM

## 2021-06-19 DIAGNOSIS — Z01419 Encounter for gynecological examination (general) (routine) without abnormal findings: Secondary | ICD-10-CM | POA: Diagnosis not present

## 2021-07-12 ENCOUNTER — Ambulatory Visit
Admission: RE | Admit: 2021-07-12 | Discharge: 2021-07-12 | Disposition: A | Discharge status: Home / Self Care | Payer: 59 | Source: Ambulatory Visit | Attending: Nurse Practitioner | Admitting: Nurse Practitioner

## 2021-07-12 DIAGNOSIS — Z1231 Encounter for screening mammogram for malignant neoplasm of breast: Secondary | ICD-10-CM | POA: Diagnosis not present

## 2022-05-29 ENCOUNTER — Other Ambulatory Visit: Payer: Self-pay | Admitting: Nurse Practitioner

## 2022-05-29 DIAGNOSIS — Z0000 Encounter for general adult medical examination without abnormal findings: Secondary | ICD-10-CM

## 2022-07-14 ENCOUNTER — Ambulatory Visit: Payer: 59

## 2022-07-15 ENCOUNTER — Ambulatory Visit
Admission: RE | Admit: 2022-07-15 | Discharge: 2022-07-15 | Disposition: A | Discharge status: Home / Self Care | Payer: No Typology Code available for payment source | Source: Ambulatory Visit | Attending: Nurse Practitioner | Admitting: Nurse Practitioner

## 2022-07-15 DIAGNOSIS — Z0000 Encounter for general adult medical examination without abnormal findings: Secondary | ICD-10-CM

## 2023-05-12 ENCOUNTER — Other Ambulatory Visit: Payer: Self-pay | Admitting: Nurse Practitioner

## 2023-05-12 DIAGNOSIS — Z1231 Encounter for screening mammogram for malignant neoplasm of breast: Secondary | ICD-10-CM

## 2023-07-16 ENCOUNTER — Ambulatory Visit
Admission: RE | Admit: 2023-07-16 | Discharge: 2023-07-16 | Disposition: A | Discharge status: Home / Self Care | Source: Ambulatory Visit | Attending: Nurse Practitioner | Admitting: Nurse Practitioner

## 2023-07-16 DIAGNOSIS — Z1231 Encounter for screening mammogram for malignant neoplasm of breast: Secondary | ICD-10-CM
